# Patient Record
Sex: Male | Born: 1990 | Race: White | Hispanic: No | Marital: Married | Smoking: Never smoker
Health system: Southern US, Community
[De-identification: ages and names within clinical notes are randomized; demographics above are authoritative.]

## PROBLEM LIST (undated history)

## (undated) DIAGNOSIS — M545 Low back pain, unspecified: Secondary | ICD-10-CM

## (undated) DIAGNOSIS — F909 Attention-deficit hyperactivity disorder, unspecified type: Secondary | ICD-10-CM

## (undated) HISTORY — PX: LARYNGOSCOPY: SUR817

---

## 2009-04-02 ENCOUNTER — Ambulatory Visit: Payer: Self-pay | Admitting: Diagnostic Radiology

## 2009-04-02 ENCOUNTER — Emergency Department (HOSPITAL_BASED_OUTPATIENT_CLINIC_OR_DEPARTMENT_OTHER): Admission: EM | Admit: 2009-04-02 | Discharge: 2009-04-02 | Payer: Self-pay | Admitting: Emergency Medicine

## 2010-03-19 ENCOUNTER — Emergency Department (HOSPITAL_BASED_OUTPATIENT_CLINIC_OR_DEPARTMENT_OTHER): Admission: EM | Admit: 2010-03-19 | Discharge: 2010-03-19 | Payer: Self-pay | Admitting: Emergency Medicine

## 2011-11-15 ENCOUNTER — Emergency Department (HOSPITAL_COMMUNITY)
Admission: EM | Admit: 2011-11-15 | Discharge: 2011-11-15 | Disposition: A | Discharge status: Home / Self Care | Payer: No Typology Code available for payment source | Attending: Emergency Medicine | Admitting: Emergency Medicine

## 2011-11-15 ENCOUNTER — Emergency Department (HOSPITAL_COMMUNITY): Payer: No Typology Code available for payment source

## 2011-11-15 ENCOUNTER — Encounter (HOSPITAL_COMMUNITY): Payer: Self-pay | Admitting: Emergency Medicine

## 2011-11-15 DIAGNOSIS — S39012A Strain of muscle, fascia and tendon of lower back, initial encounter: Secondary | ICD-10-CM

## 2011-11-15 DIAGNOSIS — M25569 Pain in unspecified knee: Secondary | ICD-10-CM | POA: Insufficient documentation

## 2011-11-15 DIAGNOSIS — S060XAA Concussion with loss of consciousness status unknown, initial encounter: Secondary | ICD-10-CM | POA: Insufficient documentation

## 2011-11-15 DIAGNOSIS — S060X9A Concussion with loss of consciousness of unspecified duration, initial encounter: Secondary | ICD-10-CM | POA: Insufficient documentation

## 2011-11-15 DIAGNOSIS — Y92410 Unspecified street and highway as the place of occurrence of the external cause: Secondary | ICD-10-CM | POA: Insufficient documentation

## 2011-11-15 DIAGNOSIS — R404 Transient alteration of awareness: Secondary | ICD-10-CM | POA: Insufficient documentation

## 2011-11-15 DIAGNOSIS — S335XXA Sprain of ligaments of lumbar spine, initial encounter: Secondary | ICD-10-CM | POA: Insufficient documentation

## 2011-11-15 DIAGNOSIS — M5090 Cervical disc disorder, unspecified, unspecified cervical region: Secondary | ICD-10-CM | POA: Insufficient documentation

## 2011-11-15 DIAGNOSIS — M5380 Other specified dorsopathies, site unspecified: Secondary | ICD-10-CM | POA: Insufficient documentation

## 2011-11-15 DIAGNOSIS — T1490XA Injury, unspecified, initial encounter: Secondary | ICD-10-CM | POA: Insufficient documentation

## 2011-11-15 HISTORY — DX: Attention-deficit hyperactivity disorder, unspecified type: F90.9

## 2011-11-15 HISTORY — DX: Low back pain, unspecified: M54.50

## 2011-11-15 HISTORY — DX: Low back pain: M54.5

## 2011-11-15 MED ORDER — ACETAMINOPHEN 325 MG PO TABS
650.0000 mg | ORAL_TABLET | Freq: Once | ORAL | Status: AC
  Administered 2011-11-15: 650 mg via ORAL
  Filled 2011-11-15: qty 2
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
## 2011-11-15 NOTE — Discharge Instructions (Signed)
Back Pain, Adult Low back pain is very common. About 1 in 5 people have back pain.The cause of low back pain is rarely dangerous. The pain often gets better over time.About half of people with a sudden onset of back pain feel better in just 2 weeks. About 8 in 10 people feel better by 6 weeks.  CAUSES Some common causes of back pain include:  Strain of the muscles or ligaments supporting the spine.   Wear and tear (degeneration) of the spinal discs.   Arthritis.   Direct injury to the back.  DIAGNOSIS Most of the time, the direct cause of low back pain is not known.However, back pain can be treated effectively even when the exact cause of the pain is unknown.Answering your caregiver's questions about your overall health and symptoms is one of the most accurate ways to make sure the cause of your pain is not dangerous. If your caregiver needs more information, he or she may order lab work or imaging tests (X-rays or MRIs).However, even if imaging tests show changes in your back, this usually does not require surgery. HOME CARE INSTRUCTIONS For many people, back pain returns.Since low back pain is rarely dangerous, it is often a condition that people can learn to manageon their own.   Remain active. It is stressful on the back to sit or stand in one place. Do not sit, drive, or stand in one place for more than 30 minutes at a time. Take short walks on level surfaces as soon as pain allows.Try to increase the length of time you walk each day.   Do not stay in bed.Resting more than 1 or 2 days can delay your recovery.   Do not avoid exercise or work.Your body is made to move.It is not dangerous to be active, even though your back may hurt.Your back will likely heal faster if you return to being active before your pain is gone.   Pay attention to your body when you bend and lift. Many people have less discomfortwhen lifting if they bend their knees, keep the load close to their  bodies,and avoid twisting. Often, the most comfortable positions are those that put less stress on your recovering back.   Find a comfortable position to sleep. Use a firm mattress and lie on your side with your knees slightly bent. If you lie on your back, put a pillow under your knees.   Only take over-the-counter or prescription medicines as directed by your caregiver. Over-the-counter medicines to reduce pain and inflammation are often the most helpful.Your caregiver may prescribe muscle relaxant drugs.These medicines help dull your pain so you can more quickly return to your normal activities and healthy exercise.   Put ice on the injured area.   Put ice in a plastic bag.   Place a towel between your skin and the bag.   Leave the ice on for 15 to 20 minutes, 3 to 4 times a day for the first 2 to 3 days. After that, ice and heat may be alternated to reduce pain and spasms.   Ask your caregiver about trying back exercises and gentle massage. This may be of some benefit.   Avoid feeling anxious or stressed.Stress increases muscle tension and can worsen back pain.It is important to recognize when you are anxious or stressed and learn ways to manage it.Exercise is a great option.  SEEK MEDICAL CARE IF:  You have pain that is not relieved with rest or medicine.   You have   pain that does not improve in 1 week.   You have new symptoms.   You are generally not feeling well.  SEEK IMMEDIATE MEDICAL CARE IF:   You have pain that radiates from your back into your legs.   You develop new bowel or bladder control problems.   You have unusual weakness or numbness in your arms or legs.   You develop nausea or vomiting.   You develop abdominal pain.   You feel faint.  Document Released: 09/17/2005 Document Revised: 05/30/2011 Document Reviewed: 02/05/2011 [facility] Patient Information 2012 [place], [place].Back Pain, Adult Low back pain is very common. About 1 in 5 people have back  pain.The cause of low back pain is rarely dangerous. The pain often gets better over time.About half of people with a sudden onset of back pain feel better in just 2 weeks. About 8 in 10 people feel better by 6 weeks.  CAUSES Some common causes of back pain include:  Strain of the muscles or ligaments supporting the spine.   Wear and tear (degeneration) of the spinal discs.   Arthritis.   Direct injury to the back.  DIAGNOSIS Most of the time, the direct cause of low back pain is not known.However, back pain can be treated effectively even when the exact cause of the pain is unknown.Answering your caregiver's questions about your overall health and symptoms is one of the most accurate ways to make sure the cause of your pain is not dangerous. If your caregiver needs more information, he or she may order lab work or imaging tests (X-rays or MRIs).However, even if imaging tests show changes in your back, this usually does not require surgery. HOME CARE INSTRUCTIONS For many people, back pain returns.Since low back pain is rarely dangerous, it is often a condition that people can learn to [facility] their own.   Remain active. It is stressful on the back to sit or stand in one place. Do not sit, drive, or stand in one place for more than 30 minutes at a time. Take short walks on level surfaces as soon as pain allows.Try to increase the length of time you walk each day.   Do not stay in bed.Resting more than 1 or 2 days can delay your recovery.   Do not avoid exercise or work.Your body is made to move.It is not dangerous to be active, even though your back may hurt.Your back will likely heal faster if you return to being active before your pain is gone.   Pay attention to your body when you bend and lift. Many people have less discomfortwhen lifting if they bend their knees, keep the load close to their bodies,and avoid twisting. Often, the most comfortable positions are those that put  less stress on your recovering back.   Find a comfortable position to sleep. Use a firm mattress and lie on your side with your knees slightly bent. If you lie on your back, put a pillow under your knees.   Only take over-the-counter or prescription medicines as directed by your caregiver. Over-the-counter medicines to reduce pain and inflammation are often the most helpful.Your caregiver may prescribe muscle relaxant drugs.These medicines help dull your pain so you can more quickly return to your normal activities and healthy exercise.   Put ice on the injured area.   Put ice in a plastic bag.   Place a towel between your skin and the bag.   Leave the ice on for 15 to 20 minutes, 3 to  4 times a day for the first 2 to 3 days. After that, ice and heat may be alternated to reduce pain and spasms.   Ask your caregiver about trying back exercises and gentle massage. This may be of some benefit.   Avoid feeling anxious or stressed.Stress increases muscle tension and can worsen back pain.It is important to recognize when you are anxious or stressed and learn ways to manage it.Exercise is a great option.  SEEK MEDICAL CARE IF:  You have pain that is not relieved with rest or medicine.   You have pain that does not improve in 1 week.   You have new symptoms.   You are generally not feeling well.  SEEK IMMEDIATE MEDICAL CARE IF:   You have pain that radiates from your back into your legs.   You develop new bowel or bladder control problems.   You have unusual weakness or numbness in your arms or legs.   You develop nausea or vomiting.   You develop abdominal pain.   You feel faint.  Document Released: 09/17/2005 Document Revised: 05/30/2011 Document Reviewed: 02/05/2011 [facility] Patient Information 2012 [place], [place].
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
## 2018-12-15 ENCOUNTER — Ambulatory Visit: Payer: Self-pay | Admitting: Psychiatry

## 2018-12-16 ENCOUNTER — Ambulatory Visit: Payer: Self-pay | Admitting: Psychiatry

## 2018-12-17 ENCOUNTER — Other Ambulatory Visit: Payer: Self-pay

## 2018-12-17 ENCOUNTER — Encounter: Payer: Self-pay | Admitting: Psychiatry

## 2018-12-17 ENCOUNTER — Ambulatory Visit (INDEPENDENT_AMBULATORY_CARE_PROVIDER_SITE_OTHER): Payer: Self-pay | Admitting: Psychiatry

## 2018-12-17 VITALS — BP 143/76 | HR 72 | Ht 72.0 in | Wt 185.0 lb

## 2018-12-17 DIAGNOSIS — F319 Bipolar disorder, unspecified: Secondary | ICD-10-CM

## 2018-12-17 DIAGNOSIS — F411 Generalized anxiety disorder: Secondary | ICD-10-CM

## 2018-12-17 DIAGNOSIS — F4310 Post-traumatic stress disorder, unspecified: Secondary | ICD-10-CM

## 2018-12-17 MED ORDER — FLUOXETINE HCL 20 MG PO CAPS: ORAL_CAPSULE | ORAL | 0 refills | Status: DC

## 2018-12-17 MED ORDER — BUSPIRONE HCL 15 MG PO TABS: ORAL_TABLET | ORAL | 1 refills | Status: DC

## 2018-12-17 MED ORDER — DIVALPROEX SODIUM 500 MG PO DR TAB: DELAYED_RELEASE_TABLET | ORAL | 1 refills | Status: DC

## 2018-12-18 ENCOUNTER — Encounter: Payer: Self-pay | Admitting: Psychiatry

## 2018-12-18 ENCOUNTER — Telehealth: Payer: Self-pay | Admitting: Psychiatry

## 2018-12-18 NOTE — Progress Notes (Signed)
[facility] MD/PA/NP Initial Note  12/18/2018 9:59 AM Jorge Tate  MRN:  [id number]  Chief Complaint:  Chief Complaint    Anxiety; Depression; Manic Behavior      HPI: Patient is a 28 year old male who presents for initial evaluation for treatment of anxiety, depression, and manic signs and symptoms.  He reports that he was encouraged to seek treatment by his wife and his therapist and that his therapist, Tina Griffiths, discussed that patient has exhibited signs and symptoms suggestive of bipolar disorder.  He reports that his wife is concerned about his stress, anxiety with doing new things, and low sex drive.  He reports "I"ve always been really up and down." Recalls depressive episodes as early as 6th grade.  He reports mood s/s have gradually worsened and have been significantly worse over the last 2 years. Patient reports that he was taking Vyvanse and Prozac last year and PCP thought he needed to stop them and "it was great for about 3 months and then everything came crashing down." Has had 5 jobs since then and has been "stressed out."  He reports that his mood has significantly worsened in recent years following the death of several family members, to include a traumatic event.  Patient reports mood lability.  He reports that his mood may be "really up for 3 days then miserable for 5." Reports that mood shifts can occur as quickly as every few hours. Reports that he also has periods where mood is depressed and irritable. Reports poor sleep. Reports that he may sleep 9 hours one night and 4 hours the next night. Sleeps more when mood is depressed and less when mood is elevated. Reports periods of elevated energy alternating with low energy when depressed. Appetite also fluctuates- "I'm either really hungry or not at all." Weight has been stable. Denies impaired concentration. Reports during depressed periods his energy and motivation are low and he experiences anhedonia. Denies SI.   Reports  that he has periods of elevated mood for typically about 3 days with longest episode being about a week in duration. He reports that his energy is elevated and he is doing more of the activities that he enjoys. Reports that irritability is higher at times during these periods. Occasional racing thoughts. Periods of increased talkativeness. Reports some periods where self-confidence was slightly elevated.  Reports periods of slightly increased goal-directed activity. Denies risky or impulsive behaviors. Denies increased interest in sex.   Reports long-standing worry and anxiety since childhood. He reports that he will "dread" events several days in a row. Reports some rumination and catastrophic thoughts. Will occasionally experience muscle tension and increased heart rate with increased heart rate. Anxiety is worse  Reports intrusive memories and re-experiencing. Reports some nightmares about traumatic events. Reports nightmares have gradually decreased in frequency- "but when they happen they are pretty bad." Reports hyper-vigilance and occasional exaggerated startle responses.   Denies social anxiety, obsessions, or compulsions.  He reports anxiety. Has had severe panic attacks and had to leave due to panic attacks at 2 jobs and then was released. He reports that he is currently having panic attacks a couple times a week. Reports that panic attacks recently started and that they will occur out of the blue without apparent trigger or pattern.   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 He reports he has severe irritability and will lose his temper quickly over things like someone insulting his favorite sports teams. He reports that he avoids having conversations with certain people since he knows it may trigger his anger. He denies physical agitation.  Denies irritability or anger causing problems at work.   Was dx'd with ADHD in first grade and started treatment then. Denies hyperactivity. Denies significant difficulty with concentration currently.   Reports decreased libido.   Reports that he has had some hypnopompic AH and thinking he hears someone coming in the house. Otherwise denies AH, VH, or paranoia.  Born and raised in [place]. Has 2 brothers and 3 sisters. He is the second to the youngest. Reports that some of his siblings are half-siblings. He grew up with brother and 2 sisters. He was raised by both parents and reports things were "good." Father died unexpectedly in 01/31/16 from pulmonary embolism. He was present for his father's death and had to perform CPR. His wife was also there. He went into a "drinking binge." Reports drinking 6-12 beers for 5 months. Reports he stopped drinking and now drinks very rarely. Grandmother died the following year.  Graduated high school. Has been working in [employer]. Married x 2 years. He reports that their relationship "seems to be aright" and wife is concerned about his mental health. Pt and his wife had a still born in January. They do not have any children. Wife, brother, and mother are main psychosocial supports. Umpires baseball. Enjoys motor sports and watching football.     Visit Diagnosis:    ICD-10-CM   1. Bipolar affective disorder, remission status unspecified (HCC) F31.9 divalproex (DEPAKOTE) 500 MG DR tablet  2. Generalized anxiety disorder F41.1 busPIRone (BUSPAR) 15 MG tablet  3. PTSD (post-traumatic stress disorder) F43.10 busPIRone (BUSPAR) 15 MG tablet    Past Psychiatric History:   Past Medical History:  Past Medical History:  Diagnosis Date  . ADHD (attention deficit hyperactivity disorder)   . Low back pain     Past Surgical History:  Procedure Laterality Date  . LARYNGOSCOPY      Family History:  Family History  Problem Relation Age of Onset  . Anxiety  disorder Father   . Pulmonary embolism Father     Social History:  Social History   Socioeconomic History  . Marital status: Married    Spouse name: Not on file  . Number of children: Not on file  . Years of education: Not on file  . Highest education level: Not on file  Occupational History  . Not on file  Social Needs  . Financial resource strain: Not on file  . Food insecurity:    Worry: Not on file    Inability: Not on file  . Transportation needs:    Medical: Not on file    Non-medical: Not on file  Tobacco Use  . Smoking status: Never Smoker  . Smokeless tobacco: Never Used  Substance and Sexual Activity  . Alcohol use: Yes    Comment: rare  . Drug use: Not Currently  . Sexual activity: Not on file  Lifestyle  . Physical activity:    Days per week: Not on file    Minutes per session: Not on file  . Stress: Not on file  Relationships  . Social connections:    Talks on phone: Not on file    Gets together: Not on file    Attends religious service: Not on file  Active member of club or organization: Not on file    Attends meetings of clubs or organizations: Not on file    Relationship status: Not on file  Other Topics Concern  . Not on file  Social History Narrative  . Not on file    Allergies: No Known Allergies  Metabolic Disorder Labs: No results found for: HGBA1C, MPG No results found for: PROLACTIN No results found for: CHOL, TRIG, HDL, CHOLHDL, VLDL, LDLCALC No results found for: TSH  Therapeutic Level Labs: No results found for: LITHIUM No results found for: VALPROATE No components found for:  CBMZ  Current Medications: Current Outpatient Medications  Medication Sig Dispense Refill  . FLUoxetine (PROZAC) 20 MG capsule Take Prozac every other day 30 capsule 0  . Melatonin 10 MG TABS Take 20 mg by mouth every other day.    . busPIRone (BUSPAR) 15 MG tablet Take 1/3 tablet p.o. twice daily for 1 week, then take 2/3 tablet p.o. twice daily  for 1 week, then take 1 tablet p.o. twice daily 60 tablet 1  . divalproex (DEPAKOTE) 500 MG DR tablet Take 1 tablet at bedtime for 5-7 days, then increase to 1 tablet twice daily 60 tablet 1   No current facility-administered medications for this visit.     Medication Side Effects: N/A  Orders placed this visit:  No orders of the defined types were placed in this encounter.   Psychiatric Specialty Exam:  ROS  Blood pressure (!) 143/76, pulse 72, height 6' (1.829 m), weight 185 lb (83.9 kg).Body mass index is 25.09 kg/m.  General Appearance: Casual and Neat  Eye Contact:  Good  Speech:  Clear and Coherent and Normal Rate  Volume:  Normal  Mood:  Anxious and Depressed  Affect:  Blunt  Thought Process:  Coherent  Orientation:  Full (Time, Place, and Person)  Thought Content: Logical   Suicidal Thoughts:  No  Homicidal Thoughts:  No  Memory:  WNL  Judgement:  Good  Insight:  Good  Psychomotor Activity:  Normal  Concentration:  Concentration: Fair  Recall:  Good  Fund of Knowledge: Good  Language: Good  Assets:  Communication Skills Desire for Improvement Resilience Social Support  ADL's:  Intact  Cognition: WNL  Prognosis:  Good   Receiving Psychotherapy: Yes   Treatment Plan/Recommendations: Patient seen for 60 minutes and greater than 50% of visit spent counseling patient regarding signs and symptoms of bipolar disorder, to include describing different mood states such as mania, hypomania, and depression.  Also counseled patient regarding anxiety signs and symptoms.  Discussed types of medications used to treat bipolar disorder and anxiety.  Case staffed with Dr. Jennelle Human.  Will start divalproex 500 mg p.o. nightly for 5 to 7 days, then increase to 1 tablet twice daily for mood stabilization.  Discussed potential benefits, risk, and side effects of Depakote and BuSpar.  Discussed that patient may require occasional lab monitoring with Depakote to assess for changes in liver  enzymes, CBC, or to obtain therapeutic drug level.  Start BuSpar 15 mg 1/3 tablet twice daily for 1 week, then increase to 2/3 tablet twice daily for 1 week, then increase to 1 tablet twice daily for anxiety.  Recommend continuing psychotherapy with [facility].  Patient to follow-up with this provider in 3 to 4 weeks or sooner if clinically indicated. Patient advised to contact office with any questions, adverse effects, or acute worsening in signs and symptoms.     Corie Chiquito, PMHNP

## 2018-12-18 NOTE — Telephone Encounter (Signed)
Pt started on new med and its making him fuzzy headed. His employer sent him home for today and tomorrow. He would like a note to give to his job. He will be in [place] tomorrow around 9am to pick it up.

## 2019-01-28 ENCOUNTER — Encounter: Payer: Self-pay | Admitting: Psychiatry

## 2019-01-28 ENCOUNTER — Ambulatory Visit (INDEPENDENT_AMBULATORY_CARE_PROVIDER_SITE_OTHER): Payer: Self-pay | Admitting: Psychiatry

## 2019-01-28 ENCOUNTER — Other Ambulatory Visit: Payer: Self-pay

## 2019-01-28 DIAGNOSIS — F411 Generalized anxiety disorder: Secondary | ICD-10-CM

## 2019-01-28 DIAGNOSIS — F4310 Post-traumatic stress disorder, unspecified: Secondary | ICD-10-CM

## 2019-01-28 DIAGNOSIS — F319 Bipolar disorder, unspecified: Secondary | ICD-10-CM

## 2019-01-28 MED ORDER — BUSPIRONE HCL 15 MG PO TABS: 15.0000 mg | ORAL_TABLET | Freq: Two times a day (BID) | ORAL | 1 refills | Status: DC

## 2019-01-28 MED ORDER — DIVALPROEX SODIUM 500 MG PO DR TAB: DELAYED_RELEASE_TABLET | ORAL | 1 refills | Status: DC

## 2019-01-28 NOTE — Progress Notes (Signed)
Jorge Tate [id number] 1991/08/21 28 y.o.  Virtual Visit via Telephone Note  I connected with@ on 01/28/19 at  9:00 AM EDT by telephone and verified that I am speaking with the correct person using two identifiers.   I discussed the limitations, risks, security and privacy concerns of performing an evaluation and management service by telephone and the availability of in person appointments. I also discussed with the patient that there may be a patient responsible charge related to this service. The patient expressed understanding and agreed to proceed.   I discussed the assessment and treatment plan with the patient. The patient was provided an opportunity to ask questions and all were answered. The patient agreed with the plan and demonstrated an understanding of the instructions.   The patient was advised to call back or seek an in-person evaluation if the symptoms worsen or if the condition fails to improve as anticipated.  I provided 30 minutes of non-face-to-face time during this encounter.  The patient was located at home.  The provider was located at home.   Corie Chiquito, PMHNP   Subjective:   Patient ID:  Jorge Tate is a 28 y.o. (DOB 02-15-1991) male.  Chief Complaint:  Chief Complaint  Patient presents with  . Other    Mood lability, irritability  . Insomnia  . Anxiety    HPI Jorge Tate presents for follow-up of mood instability. "The medicine seems to be working." He reports that he has been calmer and no longer feeling as stressed. He reports that his mood has improved. He reports that he has only experienced irritability about 3-4 days since last visit. He reports that he has had one episode where he lost his temper about a week ago. He reports some slight mood lability and that overall mood lability has improved. Denies recent depressed mood. Reports that he continues to have some irritability. Denies any recent manic s/s. Denies any recent panic  attacks.  He reports that he has trouble getting to sleep a couple of times a week. He reports that sometimes after he takes Buspar he feels more awake. He reports that he usually takes Buspar at 9:30 pm. He reports that is sleeping about 7.5 hours a night and was previously sleeping 5-6 hours a night. He reports that he has been having nightmares and that it may be occuring more frequently. He reports that his appetite has been good. Reports energy and motivation have been good. Denies concentration difficulties. Denies anhedonia. Denies SI.   Has not been able to see therapist in the last few weeks due to insurance non-coverage.   Review of Systems:  Review of Systems  Musculoskeletal: Negative for gait problem.  Neurological: Negative for tremors.  Psychiatric/Behavioral:       Please refer to HPI    Medications: I have reviewed the patient's current medications.  Current Outpatient Medications  Medication Sig Dispense Refill  . busPIRone (BUSPAR) 15 MG tablet Take 1 tablet (15 mg total) by mouth 2 (two) times daily for 30 days. 60 tablet 1  . divalproex (DEPAKOTE) 500 MG DR tablet Take 1 tablet (500 mg total) by mouth every morning AND 2 tablets (1,000 mg total) at bedtime. Do all this for 30 days. Take 1 tablet at bedtime for 5-7 days, then increase to 1 tablet twice daily. 90 tablet 1   No current facility-administered medications for this visit.     Medication Side Effects: Other: Occ diff with sleep initiation  Allergies: No Known Allergies  Past Medical History:  Diagnosis Date  . ADHD (attention deficit hyperactivity disorder)   . Low back pain     Family History  Problem Relation Age of Onset  . Anxiety disorder Father   . Pulmonary embolism Father     Social History   Socioeconomic History  . Marital status: Married    Spouse name: Not on file  . Number of children: Not on file  . Years of education: Not on file  . Highest education level: Not on file   Occupational History  . Not on file  Social Needs  . Financial resource strain: Not on file  . Food insecurity:    Worry: Not on file    Inability: Not on file  . Transportation needs:    Medical: Not on file    Non-medical: Not on file  Tobacco Use  . Smoking status: Never Smoker  . Smokeless tobacco: Never Used  Substance and Sexual Activity  . Alcohol use: Yes    Comment: rare  . Drug use: Not Currently  . Sexual activity: Not on file  Lifestyle  . Physical activity:    Days per week: Not on file    Minutes per session: Not on file  . Stress: Not on file  Relationships  . Social connections:    Talks on phone: Not on file    Gets together: Not on file    Attends religious service: Not on file    Active member of club or organization: Not on file    Attends meetings of clubs or organizations: Not on file    Relationship status: Not on file  . Intimate partner violence:    Fear of current or ex partner: Not on file    Emotionally abused: Not on file    Physically abused: Not on file    Forced sexual activity: Not on file  Other Topics Concern  . Not on file  Social History Narrative  . Not on file    Past Medical History, Surgical history, Social history, and Family history were reviewed and updated as appropriate.   Please see review of systems for further details on the patient's review from today.   Objective:   Physical Exam:  There were no vitals taken for this visit.  Physical Exam Neurological:     Mental Status: He is alert and oriented to person, place, and time.     Cranial Nerves: No dysarthria.  Psychiatric:        Attention and Perception: Attention normal.        Speech: Speech normal.        Behavior: Behavior is cooperative.        Thought Content: Thought content normal. Thought content is not paranoid or delusional. Thought content does not include homicidal or suicidal ideation. Thought content does not include homicidal or suicidal  plan.        Cognition and Memory: Cognition and memory normal.        Judgment: Judgment normal.     Comments: Mildly irritable mood     Lab Review:  No results found for: NA, K, CL, CO2, GLUCOSE, BUN, CREATININE, CALCIUM, PROT, ALBUMIN, AST, ALT, ALKPHOS, BILITOT, GFRNONAA, GFRAA  No results found for: WBC, RBC, HGB, HCT, PLT, MCV, MCH, MCHC, RDW, LYMPHSABS, MONOABS, EOSABS, BASOSABS  No results found for: POCLITH, LITHIUM   No results found for: PHENYTOIN, PHENOBARB, VALPROATE, CBMZ   .res Assessment: Plan:    Discussed taking the second  dose of Buspar earlier in the evening, such as at 6 pm instead of 9:30 pm, to minimize risk of sleep disturbance.  Discussed that he could take BuSpar even earlier if needed.  Will continue BuSpar 15 mg twice daily for anxiety since this has been helpful overall for patient's anxiety.  Will increase Depakote to 500 mg p.o. every morning and 1000 mg p.o. nightly to further improve mood lability and irritability.  Recommend follow-up in 4 weeks when patient reports that his insurance will become effective, and will then decide if further titration of Depakote is indicated or if he should continue current dose.  Will consider obtaining labs at that time to monitor for any potential adverse effects with Depakote and to obtain drug level.  Patient to follow-up in 4 weeks or sooner if clinically indicated. Patient advised to contact office with any questions, adverse effects, or acute worsening in signs and symptoms.  Bipolar affective disorder, remission status unspecified (HCC) - Plan: divalproex (DEPAKOTE) 500 MG DR tablet  Generalized anxiety disorder - Plan: busPIRone (BUSPAR) 15 MG tablet  PTSD (post-traumatic stress disorder) - Plan: busPIRone (BUSPAR) 15 MG tablet  Please see After Visit Summary for patient specific instructions.  No future appointments.  No orders of the defined types were placed in this encounter.      -------------------------------

## 2019-02-16 ENCOUNTER — Other Ambulatory Visit: Payer: Self-pay | Admitting: Psychiatry

## 2019-02-16 DIAGNOSIS — F319 Bipolar disorder, unspecified: Secondary | ICD-10-CM

## 2019-02-18 ENCOUNTER — Other Ambulatory Visit: Payer: Self-pay

## 2019-02-18 ENCOUNTER — Telehealth: Payer: Self-pay | Admitting: Psychiatry

## 2019-02-18 DIAGNOSIS — F319 Bipolar disorder, unspecified: Secondary | ICD-10-CM

## 2019-02-18 MED ORDER — DIVALPROEX SODIUM 500 MG PO DR TAB: DELAYED_RELEASE_TABLET | ORAL | 0 refills | Status: DC

## 2019-02-18 MED ORDER — DIVALPROEX SODIUM 500 MG PO DR TAB
DELAYED_RELEASE_TABLET | ORAL | 0 refills | Status: DC
Start: 2019-02-18 — End: 2019-02-18

## 2019-02-18 MED ORDER — DIVALPROEX SODIUM 500 MG PO DR TAB: DELAYED_RELEASE_TABLET | ORAL | 1 refills | Status: DC

## 2019-02-18 NOTE — Telephone Encounter (Signed)
Pt request refill on Depakote @ [facility] in [facility]

## 2019-02-18 NOTE — Telephone Encounter (Signed)
Finally got his rx from 04/29 resubmitted to [facility]. Looks like it printed instead of escribing previously

## 2019-02-18 NOTE — Telephone Encounter (Signed)
Resubmitting rx for depakote to [facility], said they didn't receive.

## 2019-02-26 ENCOUNTER — Encounter: Payer: Self-pay | Admitting: Psychiatry

## 2019-02-26 ENCOUNTER — Ambulatory Visit (INDEPENDENT_AMBULATORY_CARE_PROVIDER_SITE_OTHER): Payer: Self-pay | Admitting: Psychiatry

## 2019-02-26 ENCOUNTER — Other Ambulatory Visit: Payer: Self-pay

## 2019-02-26 DIAGNOSIS — F319 Bipolar disorder, unspecified: Secondary | ICD-10-CM

## 2019-02-26 DIAGNOSIS — F411 Generalized anxiety disorder: Secondary | ICD-10-CM

## 2019-02-26 DIAGNOSIS — F4310 Post-traumatic stress disorder, unspecified: Secondary | ICD-10-CM

## 2019-02-26 NOTE — Progress Notes (Signed)
Jorge Tate [id number] 11/30/1990 28 y.o.  Virtual Visit via Telephone Note  I connected with@ on 02/27/19 at  3:30 PM EDT by telephone and verified that I am speaking with the correct person using two identifiers.   I discussed the limitations, risks, security and privacy concerns of performing an evaluation and management service by telephone and the availability of in person appointments. I also discussed with the patient that there may be a patient responsible charge related to this service. The patient expressed understanding and agreed to proceed.   I discussed the assessment and treatment plan with the patient. The patient was provided an opportunity to ask questions and all were answered. The patient agreed with the plan and demonstrated an understanding of the instructions.   The patient was advised to call back or seek an in-person evaluation if the symptoms worsen or if the condition fails to improve as anticipated.  I provided 30 minutes of non-face-to-face time during this encounter.  The patient was located at home.  The provider was located at [facility].   Corie ChiquitoJessica Mahonri Seiden, PMHNP   Subjective:   Patient ID:  Jorge Tate is a 28 y.o. (DOB 11/21/1990) male.  Chief Complaint:  Chief Complaint  Patient presents with  . Follow-up    h/o Depression and Anxiety    HPI Jorge FamJustin Underhill presents for follow-up of mood and anxiety. Pt reports that his anxiety had been better than it has been. He reports that he continues to experience some anxiety and down mood in the morning and then it improves by mid-day. He reports mood has been "better" and reports improved mood with increase in Depakote. He reports that irritability has decreased. He reports that he has had one episode of losing his temper and that this has improved. Some sadness in the morning has been consistent with the past. Reports that he is sleeping better with change in admin time of Buspar. Sleeping 7  hours every night. He reports that his appetite has been the same. Energy and motivation have been ok. Concentration has been good. Denies SI.   Denies risky or impulsive behavior.   He reports that he is having multiple episodes of diarrhea throughout the day and that diarrhea will occur for multiple consecutive days and then resolve for a few days and then return.   Review of Systems:  Review of Systems  Gastrointestinal: Positive for diarrhea. Negative for nausea and vomiting.  Musculoskeletal: Negative for gait problem.  Neurological: Negative for tremors.  Psychiatric/Behavioral:       Please refer to HPI    Medications: I have reviewed the patient's current medications.  Current Outpatient Medications  Medication Sig Dispense Refill  . busPIRone (BUSPAR) 15 MG tablet Take 1 tablet (15 mg total) by mouth 2 (two) times daily for 30 days. 60 tablet 1  . divalproex (DEPAKOTE ER) 500 MG 24 hr tablet Take 3 tablets (1,500 mg total) by mouth at bedtime for 30 days. 90 tablet 1   No current facility-administered medications for this visit.     Medication Side Effects: Other: possible diarrhea  Allergies: No Known Allergies  Past Medical History:  Diagnosis Date  . ADHD (attention deficit hyperactivity disorder)   . Low back pain     Family History  Problem Relation Age of Onset  . Anxiety disorder Father   . Pulmonary embolism Father     Social History   Socioeconomic History  . Marital status: Married    Spouse name: Not  on file  . Number of children: Not on file  . Years of education: Not on file  . Highest education level: Not on file  Occupational History  . Not on file  Social Needs  . Financial resource strain: Not on file  . Food insecurity:    Worry: Not on file    Inability: Not on file  . Transportation needs:    Medical: Not on file    Non-medical: Not on file  Tobacco Use  . Smoking status: Never Smoker  . Smokeless tobacco: Never Used   Substance and Sexual Activity  . Alcohol use: Yes    Comment: rare  . Drug use: Not Currently  . Sexual activity: Not on file  Lifestyle  . Physical activity:    Days per week: Not on file    Minutes per session: Not on file  . Stress: Not on file  Relationships  . Social connections:    Talks on phone: Not on file    Gets together: Not on file    Attends religious service: Not on file    Active member of club or organization: Not on file    Attends meetings of clubs or organizations: Not on file    Relationship status: Not on file  . Intimate partner violence:    Fear of current or ex partner: Not on file    Emotionally abused: Not on file    Physically abused: Not on file    Forced sexual activity: Not on file  Other Topics Concern  . Not on file  Social History Narrative  . Not on file    Past Medical History, Surgical history, Social history, and Family history were reviewed and updated as appropriate.   Please see review of systems for further details on the patient's review from today.   Objective:   Physical Exam:  There were no vitals taken for this visit.  Physical Exam Neurological:     Mental Status: He is alert and oriented to person, place, and time.     Cranial Nerves: No dysarthria.  Psychiatric:        Attention and Perception: Attention normal.        Speech: Speech normal.        Behavior: Behavior is cooperative.        Thought Content: Thought content normal. Thought content is not paranoid or delusional. Thought content does not include homicidal or suicidal ideation. Thought content does not include homicidal or suicidal plan.        Cognition and Memory: Cognition and memory normal.        Judgment: Judgment normal.     Comments: Mood presents as mildly irritable.      Lab Review:  No results found for: NA, K, CL, CO2, GLUCOSE, BUN, CREATININE, CALCIUM, PROT, ALBUMIN, AST, ALT, ALKPHOS, BILITOT, GFRNONAA, GFRAA  No results found for:  WBC, RBC, HGB, HCT, PLT, MCV, MCH, MCHC, RDW, LYMPHSABS, MONOABS, EOSABS, BASOSABS  No results found for: POCLITH, LITHIUM   No results found for: PHENYTOIN, PHENOBARB, VALPROATE, CBMZ   .res Assessment: Plan:   Case staffed with Dr. Jennelle Human. Will change Divalproex to Depakote ER 1500 mg po QHS for mood stabilization. Will continue Buspar 15 mg po BID for anxiety.  Pt to f/u in 4 weeks. May extended f/u if needed until he has insurance coverage.  Bipolar affective disorder, remission status unspecified (HCC) - Plan: divalproex (DEPAKOTE ER) 500 MG 24 hr tablet  Generalized anxiety  disorder - Plan: busPIRone (BUSPAR) 15 MG tablet  PTSD (post-traumatic stress disorder) - Plan: busPIRone (BUSPAR) 15 MG tablet  Please see After Visit Summary for patient specific instructions.  Future Appointments  Date Time Provider Department Center  03/27/2019 10:00 AM Corie Chiquito, PMHNP [facility] None    No orders of the defined types were placed in this encounter.     -------------------------------

## 2019-02-27 MED ORDER — DIVALPROEX SODIUM ER 500 MG PO TB24: 1500.0000 mg | ORAL_TABLET | Freq: Every day | ORAL | 1 refills | Status: DC

## 2019-02-27 MED ORDER — BUSPIRONE HCL 15 MG PO TABS: 15.0000 mg | ORAL_TABLET | Freq: Two times a day (BID) | ORAL | 1 refills | Status: DC

## 2019-03-27 ENCOUNTER — Ambulatory Visit (INDEPENDENT_AMBULATORY_CARE_PROVIDER_SITE_OTHER): Payer: Self-pay | Admitting: Psychiatry

## 2019-03-27 ENCOUNTER — Other Ambulatory Visit: Payer: Self-pay

## 2019-03-27 ENCOUNTER — Encounter: Payer: Self-pay | Admitting: Psychiatry

## 2019-03-27 DIAGNOSIS — F4310 Post-traumatic stress disorder, unspecified: Secondary | ICD-10-CM

## 2019-03-27 DIAGNOSIS — F319 Bipolar disorder, unspecified: Secondary | ICD-10-CM

## 2019-03-27 DIAGNOSIS — F411 Generalized anxiety disorder: Secondary | ICD-10-CM

## 2019-03-27 MED ORDER — BUSPIRONE HCL 15 MG PO TABS: 15.0000 mg | ORAL_TABLET | Freq: Two times a day (BID) | ORAL | 1 refills | Status: DC

## 2019-03-27 MED ORDER — DIVALPROEX SODIUM ER 500 MG PO TB24: 1500.0000 mg | ORAL_TABLET | Freq: Every day | ORAL | 1 refills | Status: DC

## 2019-03-27 NOTE — Progress Notes (Signed)
Jorge Tate [id number] 1991-01-18 28 y.o.  Virtual Visit via Telephone Note  I connected with@ on 03/27/19 at 10:00 AM EDT by telephone and verified that I am speaking with the correct person using two identifiers.   I discussed the limitations, risks, security and privacy concerns of performing an evaluation and management service by telephone and the availability of in person appointments. I also discussed with the patient that there may be a patient responsible charge related to this service. The patient expressed understanding and agreed to proceed.   I discussed the assessment and treatment plan with the patient. The patient was provided an opportunity to ask questions and all were answered. The patient agreed with the plan and demonstrated an understanding of the instructions.   The patient was advised to call back or seek an in-person evaluation if the symptoms worsen or if the condition fails to improve as anticipated.  I provided 15 minutes of non-face-to-face time during this encounter.  The patient was located at home.  The provider was located at [place].   Thayer Headings, PMHNP   Subjective:   Patient ID:  Jorge Tate is a 28 y.o. (DOB 11-Apr-1991) male.  Chief Complaint:  Chief Complaint  Patient presents with  . Follow-up    h/o Irritability, Mood lability, and anxiety    HPI Jorge Tate presents for follow-up of mood and anxiety. He reports that diarrhea resolved with change in Depakote to ER. He reports that he had a "few rough days" of increased irritability throughout the month without apparent trigger. Periods of irritability can last hours to a couple of days. He reports that these episodes have not been occurring more frequently. He denies any recent depressed moods and has not experienced any sadness in the mornings recently. He reports that anxiety has been stable. He reports some increased anxiety when periods of irritability. Sleeping  better now. Appetite stable. Concentration has been adequate. Denies SI.   Denies impulsive or risky behavior. Denies sleeplessness.    Review of Systems:  Review of Systems  Gastrointestinal: Negative for diarrhea and nausea.  Musculoskeletal: Negative for gait problem.  Neurological: Negative for tremors.  Psychiatric/Behavioral:       Please refer to HPI    Medications: I have reviewed the patient's current medications.  Current Outpatient Medications  Medication Sig Dispense Refill  . busPIRone (BUSPAR) 15 MG tablet Take 1 tablet (15 mg total) by mouth 2 (two) times daily for 30 days. 60 tablet 1  . divalproex (DEPAKOTE ER) 500 MG 24 hr tablet Take 3 tablets (1,500 mg total) by mouth at bedtime for 30 days. 90 tablet 1   No current facility-administered medications for this visit.     Medication Side Effects: None  Allergies: No Known Allergies  Past Medical History:  Diagnosis Date  . ADHD (attention deficit hyperactivity disorder)   . Low back pain     Family History  Problem Relation Age of Onset  . Anxiety disorder Father   . Pulmonary embolism Father     Social History   Socioeconomic History  . Marital status: Married    Spouse name: Not on file  . Number of children: Not on file  . Years of education: Not on file  . Highest education level: Not on file  Occupational History  . Not on file  Social Needs  . Financial resource strain: Not on file  . Food insecurity    Worry: Not on file    Inability: Not  on file  . Transportation needs    Medical: Not on file    Non-medical: Not on file  Tobacco Use  . Smoking status: Never Smoker  . Smokeless tobacco: Never Used  Substance and Sexual Activity  . Alcohol use: Yes    Comment: rare  . Drug use: Not Currently  . Sexual activity: Not on file  Lifestyle  . Physical activity    Days per week: Not on file    Minutes per session: Not on file  . Stress: Not on file  Relationships  . Social  Musicianconnections    Talks on phone: Not on file    Gets together: Not on file    Attends religious service: Not on file    Active member of club or organization: Not on file    Attends meetings of clubs or organizations: Not on file    Relationship status: Not on file  . Intimate partner violence    Fear of current or ex partner: Not on file    Emotionally abused: Not on file    Physically abused: Not on file    Forced sexual activity: Not on file  Other Topics Concern  . Not on file  Social History Narrative  . Not on file    Past Medical History, Surgical history, Social history, and Family history were reviewed and updated as appropriate.   Please see review of systems for further details on the patient's review from today.   Objective:   Physical Exam:  There were no vitals taken for this visit.  Physical Exam Neurological:     Mental Status: He is alert and oriented to person, place, and time.     Cranial Nerves: No dysarthria.  Psychiatric:        Attention and Perception: Attention normal.        Mood and Affect: Mood normal.        Speech: Speech normal.        Behavior: Behavior is cooperative.        Thought Content: Thought content normal. Thought content is not paranoid or delusional. Thought content does not include homicidal or suicidal ideation. Thought content does not include homicidal or suicidal plan.        Cognition and Memory: Cognition and memory normal.        Judgment: Judgment normal.     Lab Review:  No results found for: NA, K, CL, CO2, GLUCOSE, BUN, CREATININE, CALCIUM, PROT, ALBUMIN, AST, ALT, ALKPHOS, BILITOT, GFRNONAA, GFRAA  No results found for: WBC, RBC, HGB, HCT, PLT, MCV, MCH, MCHC, RDW, LYMPHSABS, MONOABS, EOSABS, BASOSABS  No results found for: POCLITH, LITHIUM   No results found for: PHENYTOIN, PHENOBARB, VALPROATE, CBMZ   .res Assessment: Plan:   Pt reports that he would like to continue current medications for another month and  feels that overall medications have been beneficial.   Discussed that a VPA level may be helpful to determine if dose adjustment may be appropriate. Pt reports that he may be getting insurance in the near future and would prefer to wait until he has insurance. Discussed that pt may contact office to request lab order for a VPA level if he obtains insurance prior to next visit.  Continue Depakote ER 1500 mg po QHS for mood stabilization. Continue Buspar 15 mg po BID for anxiety.  Pt to f/u in 4-6 weeks or sooner if clinically indicated.  Patient advised to contact office with any questions, adverse effects, or  acute worsening in signs and symptoms.  Jorge Tate was seen today for follow-up.  Diagnoses and all orders for this visit:  Bipolar affective disorder, remission status unspecified (HCC) -     Discontinue: divalproex (DEPAKOTE ER) 500 MG 24 hr tablet; Take 3 tablets (1,500 mg total) by mouth at bedtime for 30 days. -     divalproex (DEPAKOTE ER) 500 MG 24 hr tablet; Take 3 tablets (1,500 mg total) by mouth at bedtime for 30 days.  Generalized anxiety disorder -     Discontinue: busPIRone (BUSPAR) 15 MG tablet; Take 1 tablet (15 mg total) by mouth 2 (two) times daily for 30 days. -     busPIRone (BUSPAR) 15 MG tablet; Take 1 tablet (15 mg total) by mouth 2 (two) times daily for 30 days.  PTSD (post-traumatic stress disorder) -     Discontinue: busPIRone (BUSPAR) 15 MG tablet; Take 1 tablet (15 mg total) by mouth 2 (two) times daily for 30 days. -     busPIRone (BUSPAR) 15 MG tablet; Take 1 tablet (15 mg total) by mouth 2 (two) times daily for 30 days.    Please see After Visit Summary for patient specific instructions.  No future appointments.  No orders of the defined types were placed in this encounter.     -------------------------------

## 2019-06-17 ENCOUNTER — Telehealth: Payer: Self-pay | Admitting: Psychiatry

## 2019-06-17 ENCOUNTER — Other Ambulatory Visit: Payer: Self-pay

## 2019-06-17 DIAGNOSIS — F4310 Post-traumatic stress disorder, unspecified: Secondary | ICD-10-CM

## 2019-06-17 DIAGNOSIS — F411 Generalized anxiety disorder: Secondary | ICD-10-CM

## 2019-06-17 MED ORDER — BUSPIRONE HCL 15 MG PO TABS: 15.0000 mg | ORAL_TABLET | Freq: Two times a day (BID) | ORAL | 1 refills | Status: DC

## 2019-06-17 NOTE — Telephone Encounter (Signed)
Patient scheduled appt for Friday 9/18 at 11 am

## 2019-06-17 NOTE — Telephone Encounter (Signed)
Refill submitted per request.

## 2019-06-17 NOTE — Telephone Encounter (Signed)
Patient called and said that he needs a refill on his buspirone 15 mg sent to the [facility] on [address] in [place] on [address].

## 2019-06-19 ENCOUNTER — Encounter: Payer: Self-pay | Admitting: Psychiatry

## 2019-06-19 ENCOUNTER — Other Ambulatory Visit: Payer: Self-pay

## 2019-06-19 ENCOUNTER — Ambulatory Visit (INDEPENDENT_AMBULATORY_CARE_PROVIDER_SITE_OTHER): Payer: Self-pay | Admitting: Psychiatry

## 2019-06-19 DIAGNOSIS — F4310 Post-traumatic stress disorder, unspecified: Secondary | ICD-10-CM

## 2019-06-19 DIAGNOSIS — F319 Bipolar disorder, unspecified: Secondary | ICD-10-CM

## 2019-06-19 DIAGNOSIS — F411 Generalized anxiety disorder: Secondary | ICD-10-CM

## 2019-06-19 MED ORDER — DIVALPROEX SODIUM ER 500 MG PO TB24: 1500.0000 mg | ORAL_TABLET | Freq: Every day | ORAL | 1 refills | Status: DC

## 2019-06-19 MED ORDER — BUSPIRONE HCL 15 MG PO TABS: 15.0000 mg | ORAL_TABLET | Freq: Two times a day (BID) | ORAL | 1 refills | Status: DC

## 2019-06-19 NOTE — Progress Notes (Signed)
Jorge Tate [id number] 22-Oct-1990 28 y.o.  Virtual Visit via Telephone Note  I connected with pt on 06/19/19 at 11:00 AM EDT by telephone and verified that I am speaking with the correct person using two identifiers.   I discussed the limitations, risks, security and privacy concerns of performing an evaluation and management service by telephone and the availability of in person appointments. I also discussed with the patient that there may be a patient responsible charge related to this service. The patient expressed understanding and agreed to proceed.   I discussed the assessment and treatment plan with the patient. The patient was provided an opportunity to ask questions and all were answered. The patient agreed with the plan and demonstrated an understanding of the instructions.   The patient was advised to call back or seek an in-person evaluation if the symptoms worsen or if the condition fails to improve as anticipated.  I provided 10 minutes of non-face-to-face time during this encounter.  The patient was located at home.  The provider was located at [place].   Thayer Headings, PMHNP   Subjective:   Patient ID:  Jorge Tate is a 28 y.o. (DOB 1991/02/08) male.  Chief Complaint: No chief complaint on file.   HPI Jorge Tate presents for follow-up of mood and anxiety.  "The medicines are working great and no Multimedia programmer." He reports that his mood has been stable. Denies irritable or depressed moods. He reports that anxiety has been manageable with the exception of a few days where he had some anxiety that was manageable. He reports that sleep has been signficantly better and averges about 7.5 hours a night. Appetite has been good. Denies low energy or low motivation. Reports that concentration has been good. Denies impulsive or risky behavior. Denies SI.   Reports that he does not have insurance and it will be several months at least before he may have  insurance. 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   Review of Systems:  Review of Systems  Musculoskeletal: Negative for gait problem.  Neurological: Negative for tremors.  Psychiatric/Behavioral:       Please refer to HPI    Medications: I have reviewed the patient's current medications.  Current Outpatient Medications  Medication Sig Dispense Refill  . busPIRone (BUSPAR) 15 MG tablet Take 1 tablet (15 mg total) by mouth 2 (two) times daily. 180 tablet 1  . divalproex (DEPAKOTE ER) 500 MG 24 hr tablet Take 3 tablets (1,500 mg total) by mouth at bedtime. 90 tablet 1   No current facility-administered medications for this visit.     Medication Side Effects: None  Allergies: No Known Allergies  Past Medical History:  Diagnosis Date  . ADHD (attention deficit hyperactivity disorder)   . Low back pain     Family History  Problem Relation Age of Onset  . Anxiety disorder Father   . Pulmonary embolism Father     Social History   Socioeconomic History  . Marital status: Married    Spouse name: Not on file  . Number of children: Not on file  . Years of education: Not on file  . Highest education level: Not on file  Occupational History  . Not on file  Social Needs  . Financial resource strain: Not on file  . Food insecurity    Worry: Not on file    Inability: Not on file  . Transportation needs    Medical: Not on file  Non-medical: Not on file  Tobacco Use  . Smoking status: Never Smoker  . Smokeless tobacco: Never Used  Substance and Sexual Activity  . Alcohol use: Yes    Comment: rare  . Drug use: Not Currently  . Sexual activity: Not on file  Lifestyle  . Physical activity    Days per week: Not on file    Minutes per session: Not on file  . Stress: Not on file  Relationships  . Social Musician on phone: Not on file    Gets together: Not on file    Attends religious service: Not on file    Active  member of club or organization: Not on file    Attends meetings of clubs or organizations: Not on file    Relationship status: Not on file  . Intimate partner violence    Fear of current or ex partner: Not on file    Emotionally abused: Not on file    Physically abused: Not on file    Forced sexual activity: Not on file  Other Topics Concern  . Not on file  Social History Narrative  . Not on file    Past Medical History, Surgical history, Social history, and Family history were reviewed and updated as appropriate.   Please see review of systems for further details on the patient's review from today.   Objective:   Physical Exam:  There were no vitals taken for this visit.  Physical Exam Neurological:     Mental Status: He is alert and oriented to person, place, and time.     Cranial Nerves: No dysarthria.  Psychiatric:        Attention and Perception: Attention normal.        Mood and Affect: Mood normal.        Speech: Speech normal.        Behavior: Behavior is cooperative.        Thought Content: Thought content normal. Thought content is not paranoid or delusional. Thought content does not include homicidal or suicidal ideation. Thought content does not include homicidal or suicidal plan.        Cognition and Memory: Cognition and memory normal.        Judgment: Judgment normal.     Lab Review:  No results found for: NA, K, CL, CO2, GLUCOSE, BUN, CREATININE, CALCIUM, PROT, ALBUMIN, AST, ALT, ALKPHOS, BILITOT, GFRNONAA, GFRAA  No results found for: WBC, RBC, HGB, HCT, PLT, MCV, MCH, MCHC, RDW, LYMPHSABS, MONOABS, EOSABS, BASOSABS  No results found for: POCLITH, LITHIUM   No results found for: PHENYTOIN, PHENOBARB, VALPROATE, CBMZ   .res Assessment: Plan:   Will continue current plan of care since pt reports that mood and anxiety s/s are well controlled without tolerability issues.  Advised pt to notify provider if he obtains health insurance before next visit and  labs could be ordered since pt has requested in the past that he would like to defer lab testing until he has health insurance.  Pt to f/u in 6 months or sooner if clinically indicated.  Patient advised to contact office with any questions, adverse effects, or acute worsening in signs and symptoms.  Diagnoses and all orders for this visit:  Generalized anxiety disorder -     busPIRone (BUSPAR) 15 MG tablet; Take 1 tablet (15 mg total) by mouth 2 (two) times daily.  PTSD (post-traumatic stress disorder) -     busPIRone (BUSPAR) 15 MG tablet; Take 1 tablet (  15 mg total) by mouth 2 (two) times daily.  Bipolar affective disorder, remission status unspecified (HCC) -     divalproex (DEPAKOTE ER) 500 MG 24 hr tablet; Take 3 tablets (1,500 mg total) by mouth at bedtime.    Please see After Visit Summary for patient specific instructions.  No future appointments.  No orders of the defined types were placed in this encounter.     -------------------------------

## 2019-09-28 ENCOUNTER — Other Ambulatory Visit: Payer: Self-pay

## 2019-09-28 ENCOUNTER — Telehealth: Payer: Self-pay | Admitting: Psychiatry

## 2019-09-28 DIAGNOSIS — F411 Generalized anxiety disorder: Secondary | ICD-10-CM

## 2019-09-28 DIAGNOSIS — F4310 Post-traumatic stress disorder, unspecified: Secondary | ICD-10-CM

## 2019-09-28 MED ORDER — BUSPIRONE HCL 15 MG PO TABS: 15.0000 mg | ORAL_TABLET | Freq: Two times a day (BID) | ORAL | 1 refills | Status: DC

## 2019-09-28 NOTE — Telephone Encounter (Signed)
[facility] updated and Buspar 15 mg Rx submitted

## 2019-09-28 NOTE — Telephone Encounter (Signed)
Pt called to request refill on Buspar 15 mg @ new pharmacy [facility] [address]. Will be Pharmacy now that he has insurance. Next appt 3/4

## 2019-12-03 ENCOUNTER — Other Ambulatory Visit: Payer: Self-pay

## 2019-12-03 ENCOUNTER — Ambulatory Visit: Payer: BC Managed Care – PPO | Admitting: Psychiatry

## 2019-12-03 ENCOUNTER — Encounter: Payer: Self-pay | Admitting: Psychiatry

## 2019-12-03 VITALS — Wt 189.0 lb

## 2019-12-03 DIAGNOSIS — F319 Bipolar disorder, unspecified: Secondary | ICD-10-CM

## 2019-12-03 DIAGNOSIS — F411 Generalized anxiety disorder: Secondary | ICD-10-CM

## 2019-12-03 DIAGNOSIS — Z79899 Other long term (current) drug therapy: Secondary | ICD-10-CM

## 2019-12-03 DIAGNOSIS — F4310 Post-traumatic stress disorder, unspecified: Secondary | ICD-10-CM | POA: Diagnosis not present

## 2019-12-03 MED ORDER — BUSPIRONE HCL 15 MG PO TABS: 15.0000 mg | ORAL_TABLET | Freq: Two times a day (BID) | ORAL | 1 refills | Status: DC

## 2019-12-03 MED ORDER — DIVALPROEX SODIUM ER 500 MG PO TB24: 1500.0000 mg | ORAL_TABLET | Freq: Every day | ORAL | 1 refills | Status: DC

## 2019-12-03 NOTE — Progress Notes (Signed)
[name] [id number] 08-Dec-1990 29 y.o.  Subjective:   Patient ID:  Jorge Tate is a 29 y.o. (DOB [birth year]) male.  Chief Complaint:  Chief Complaint  Patient presents with  . Follow-up    h/o mood disturbance    HPI Jorge Tate presents to the office today for follow-up of mood and anxiety. Mood has been stable and denies irritability. Denies depressed mood. Occ anxiety, typically in the morning and sometimes accompanied with some worry. Denies physical s/s with anxiety. Denies any episodes of severe anger. Sleeping well. Sleeping 5-7 hours a night. Appetite is stable. Energy and motivation have been stable. Denies concentration impairment. Denies SI.    Work is going well.   Review of Systems:  Review of Systems  Gastrointestinal: Negative for diarrhea.  Musculoskeletal: Negative for gait problem.  Neurological: Negative for tremors.  Psychiatric/Behavioral:       Please refer to HPI    Medications: I have reviewed the patient's current medications.  Current Outpatient Medications  Medication Sig Dispense Refill  . busPIRone (BUSPAR) 15 MG tablet Take 1 tablet (15 mg total) by mouth 2 (two) times daily. 180 tablet 1  . divalproex (DEPAKOTE ER) 500 MG 24 hr tablet Take 3 tablets (1,500 mg total) by mouth at bedtime. 90 tablet 1   No current facility-administered medications for this visit.    Medication Side Effects: None  Allergies: No Known Allergies  Past Medical History:  Diagnosis Date  . ADHD (attention deficit hyperactivity disorder)   . Low back pain     Family History  Problem Relation Age of Onset  . Anxiety disorder Father   . Pulmonary embolism Father     Social History   Socioeconomic History  . Marital status: Married    Spouse name: Not on file  . Number of children: Not on file  . Years of education: Not on file  . Highest education level: Not on file  Occupational History  . Not on file  Tobacco Use  . Smoking status: Never  Smoker  . Smokeless tobacco: Never Used  Substance and Sexual Activity  . Alcohol use: Yes    Comment: rare  . Drug use: Not Currently  . Sexual activity: Not on file  Other Topics Concern  . Not on file  Social History Narrative  . Not on file   Social Determinants of Health   Financial Resource Strain:   . Difficulty of Paying Living Expenses: Not on file  Food Insecurity:   . Worried About Charity fundraiser in the Last Year: Not on file  . Ran Out of Food in the Last Year: Not on file  Transportation Needs:   . Lack of Transportation (Medical): Not on file  . Lack of Transportation (Non-Medical): Not on file  Physical Activity:   . Days of Exercise per Week: Not on file  . Minutes of Exercise per Session: Not on file  Stress:   . Feeling of Stress : Not on file  Social Connections:   . Frequency of Communication with Friends and Family: Not on file  . Frequency of Social Gatherings with Friends and Family: Not on file  . Attends Religious Services: Not on file  . Active Member of Clubs or Organizations: Not on file  . Attends Archivist Meetings: Not on file  . Marital Status: Not on file  Intimate Partner Violence:   . Fear of Current or Ex-Partner: Not on file  . Emotionally Abused: Not on  file  . Physically Abused: Not on file  . Sexually Abused: Not on file    Past Medical History, Surgical history, Social history, and Family history were reviewed and updated as appropriate.   Please see review of systems for further details on the patient's review from today.   Objective:   Physical Exam:  Wt 189 lb (85.7 kg)   BMI 25.63 kg/m   Physical Exam Constitutional:      General: He is not in acute distress. Musculoskeletal:        General: No deformity.  Neurological:     Mental Status: He is alert and oriented to person, place, and time.     Coordination: Coordination normal.  Psychiatric:        Attention and Perception: Attention and  perception normal. He does not perceive auditory or visual hallucinations.        Mood and Affect: Mood normal. Mood is not anxious or depressed. Affect is not labile, blunt, angry or inappropriate.        Speech: Speech normal.        Behavior: Behavior normal.        Thought Content: Thought content normal. Thought content is not paranoid or delusional. Thought content does not include homicidal or suicidal ideation. Thought content does not include homicidal or suicidal plan.        Cognition and Memory: Cognition and memory normal.        Judgment: Judgment normal.     Comments: Insight intact     Lab Review:  No results found for: NA, K, CL, CO2, GLUCOSE, BUN, CREATININE, CALCIUM, PROT, ALBUMIN, AST, ALT, ALKPHOS, BILITOT, GFRNONAA, GFRAA  No results found for: WBC, RBC, HGB, HCT, PLT, MCV, MCH, MCHC, RDW, LYMPHSABS, MONOABS, EOSABS, BASOSABS  No results found for: POCLITH, LITHIUM   No results found for: PHENYTOIN, PHENOBARB, VALPROATE, CBMZ   .res Assessment: Plan:   Will continue current plan of care since target signs and symptoms are well controlled without any tolerability issues. Will order labs to monitor for adverse effects obtain Depakote level since patient now has health insurance.  Will order valproic acid level and CBC, CMP. Patient follow-up in 6 months or sooner if clinically indicated. Patient advised to contact office with any questions, adverse effects, or acute worsening in signs and symptoms.  Audric was seen today for follow-up.  Diagnoses and all orders for this visit:  Bipolar affective disorder, remission status unspecified (HCC) -     divalproex (DEPAKOTE ER) 500 MG 24 hr tablet; Take 3 tablets (1,500 mg total) by mouth at bedtime.  PTSD (post-traumatic stress disorder) -     busPIRone (BUSPAR) 15 MG tablet; Take 1 tablet (15 mg total) by mouth 2 (two) times daily.  Generalized anxiety disorder -     busPIRone (BUSPAR) 15 MG tablet; Take 1 tablet  (15 mg total) by mouth 2 (two) times daily.  High risk medication use -     Valproic acid level -     CBC -     Comprehensive metabolic panel     Please see After Visit Summary for patient specific instructions.  Future Appointments  Date Time Provider Department Center  06/07/2020 10:00 AM Corie Chiquito, PMHNP [facility] None    Orders Placed This Encounter  Procedures  . Valproic acid level  . CBC  . Comprehensive metabolic panel    -------------------------------

## 2019-12-07 ENCOUNTER — Other Ambulatory Visit: Payer: Self-pay | Admitting: Psychiatry

## 2019-12-07 DIAGNOSIS — F319 Bipolar disorder, unspecified: Secondary | ICD-10-CM

## 2020-06-07 ENCOUNTER — Ambulatory Visit: Payer: BC Managed Care – PPO | Admitting: Psychiatry

## 2020-06-22 ENCOUNTER — Other Ambulatory Visit: Payer: Self-pay

## 2020-06-22 ENCOUNTER — Encounter: Payer: Self-pay | Admitting: Psychiatry

## 2020-06-22 ENCOUNTER — Ambulatory Visit (INDEPENDENT_AMBULATORY_CARE_PROVIDER_SITE_OTHER): Payer: BC Managed Care – PPO | Admitting: Psychiatry

## 2020-06-22 DIAGNOSIS — F319 Bipolar disorder, unspecified: Secondary | ICD-10-CM | POA: Diagnosis not present

## 2020-06-22 DIAGNOSIS — F4310 Post-traumatic stress disorder, unspecified: Secondary | ICD-10-CM

## 2020-06-22 DIAGNOSIS — Z79899 Other long term (current) drug therapy: Secondary | ICD-10-CM

## 2020-06-22 DIAGNOSIS — F411 Generalized anxiety disorder: Secondary | ICD-10-CM

## 2020-06-22 MED ORDER — DIVALPROEX SODIUM ER 500 MG PO TB24
1500.0000 mg | ORAL_TABLET | Freq: Every day | ORAL | 1 refills | Status: AC
Start: 2020-06-22 — End: 2020-07-22

## 2020-06-22 MED ORDER — BUSPIRONE HCL 15 MG PO TABS: 15.0000 mg | ORAL_TABLET | Freq: Two times a day (BID) | ORAL | 1 refills | Status: AC

## 2020-06-22 NOTE — Progress Notes (Signed)
Jorge Tate [id number] Dec 16, 1990 29 y.o.  Subjective:   Patient ID:  Jorge Tate is a 29 y.o. (DOB [birth year]) male.  Chief Complaint:  Chief Complaint  Patient presents with  . Follow-up    Mood Disorder    HPI [name] presents to the office today for follow-up of mood disorder. He reports that he has been doing well. He reports that his mood has been good. He reports that he has had some slight irritability with change in work schedule from 4 am- 2:30 pm. He reports that he is still adjusting to new schedule. Denies depressed mood. Denies anxiety. Sleeping about 7 hours a night. Appetite is good. Energy and motivation have been good. Concentration is good. Denies risky or impulsive behavior. Denies SI.    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 Denies any major psychosocial changes.   Has not had labs drawn yet.  Review of Systems:  Review of Systems  Gastrointestinal: Negative.   Musculoskeletal: Negative for gait problem.  Neurological: Negative for tremors.  Psychiatric/Behavioral:       Please refer to HPI    Medications: I have reviewed the patient's current medications.  Current Outpatient Medications  Medication Sig Dispense Refill  . busPIRone (BUSPAR) 15 MG tablet Take 1 tablet (15 mg total) by mouth 2 (two) times daily. 180 tablet 1  . divalproex (DEPAKOTE ER) 500 MG 24 hr tablet Take 3 tablets (1,500 mg total) by mouth at bedtime. 270 tablet 1   No current facility-administered medications for this visit.    Medication Side Effects: None  Allergies: No Known Allergies  Past Medical History:  Diagnosis Date  . ADHD (attention deficit hyperactivity disorder)   . Low back pain     Family History  Problem Relation Age of Onset  . Anxiety disorder Father   . Pulmonary embolism Father     Social History   Socioeconomic History  . Marital status: Married    Spouse  name: Not on file  . Number of children: Not on file  . Years of education: Not on file  . Highest education level: Not on file  Occupational History  . Not on file  Tobacco Use  . Smoking status: Never Smoker  . Smokeless tobacco: Never Used  Substance and Sexual Activity  . Alcohol use: Yes    Comment: rare  . Drug use: Not Currently  . Sexual activity: Not on file  Other Topics Concern  . Not on file  Social History Narrative  . Not on file   Social Determinants of Health   Financial Resource Strain:   . Difficulty of Paying Living Expenses: Not on file  Food Insecurity:   . Worried About Programme researcher, broadcasting/film/video in the Last Year: Not on file  . Ran Out of Food in the Last Year: Not on file  Transportation Needs:   . Lack of Transportation (Medical): Not on file  . Lack of Transportation (Non-Medical): Not on file  Physical Activity:   . Days of Exercise per Week: Not on file  . Minutes of Exercise per Session: Not on file  Stress:   . Feeling of Stress : Not on file  Social Connections:   . Frequency of Communication with Friends and Family: Not on file  . Frequency of Social Gatherings with Friends and Family: Not on file  . Attends Religious Services: Not on file  .  Active Member of Clubs or Organizations: Not on file  . Attends Banker Meetings: Not on file  . Marital Status: Not on file  Intimate Partner Violence:   . Fear of Current or Ex-Partner: Not on file  . Emotionally Abused: Not on file  . Physically Abused: Not on file  . Sexually Abused: Not on file    Past Medical History, Surgical history, Social history, and Family history were reviewed and updated as appropriate.   Please see review of systems for further details on the patient's review from today.   Objective:   Physical Exam:  There were no vitals taken for this visit.  Physical Exam Constitutional:      General: He is not in acute distress. Musculoskeletal:        General:  No deformity.  Neurological:     Mental Status: He is alert and oriented to person, place, and time.     Coordination: Coordination normal.  Psychiatric:        Attention and Perception: Attention and perception normal. He does not perceive auditory or visual hallucinations.        Mood and Affect: Mood normal. Mood is not anxious or depressed. Affect is not labile, blunt, angry or inappropriate.        Speech: Speech normal.        Behavior: Behavior normal.        Thought Content: Thought content normal. Thought content is not paranoid or delusional. Thought content does not include homicidal or suicidal ideation. Thought content does not include homicidal or suicidal plan.        Cognition and Memory: Cognition and memory normal.        Judgment: Judgment normal.     Comments: Insight intact     Lab Review:  No results found for: NA, K, CL, CO2, GLUCOSE, BUN, CREATININE, CALCIUM, PROT, ALBUMIN, AST, ALT, ALKPHOS, BILITOT, GFRNONAA, GFRAA  No results found for: WBC, RBC, HGB, HCT, PLT, MCV, MCH, MCHC, RDW, LYMPHSABS, MONOABS, EOSABS, BASOSABS  No results found for: POCLITH, LITHIUM   No results found for: PHENYTOIN, PHENOBARB, VALPROATE, CBMZ   .res Assessment: Plan:   Continue Depakote ER 1500 mg po QHS for mood stabilization.  Will order labs to monitor for potential adverse effects to include CMP and CBC.  We will also order valproic acid level to determine drug level. Patient to follow-up in 6 months or sooner if clinically indicated. Patient advised to contact office with any questions, adverse effects, or acute worsening in signs and symptoms.   Tallon was seen today for follow-up.  Diagnoses and all orders for this visit:  High risk medication use -     Comprehensive metabolic panel -     CBC -     Valproic acid level  PTSD (post-traumatic stress disorder) -     busPIRone (BUSPAR) 15 MG tablet; Take 1 tablet (15 mg total) by mouth 2 (two) times  daily.  Generalized anxiety disorder -     busPIRone (BUSPAR) 15 MG tablet; Take 1 tablet (15 mg total) by mouth 2 (two) times daily.  Bipolar affective disorder, remission status unspecified (HCC) -     divalproex (DEPAKOTE ER) 500 MG 24 hr tablet; Take 3 tablets (1,500 mg total) by mouth at bedtime.     Please see After Visit Summary for patient specific instructions.  Future Appointments  Date Time Provider Department Center  12/20/2020 10:00 AM Corie Chiquito, PMHNP [facility] None  Orders Placed This Encounter  Procedures  . Comprehensive metabolic panel  . CBC  . Valproic acid level    -------------------------------

## 2020-12-20 ENCOUNTER — Ambulatory Visit: Payer: BC Managed Care – PPO | Admitting: Psychiatry

## 2023-08-14 ENCOUNTER — Encounter: Payer: Self-pay | Admitting: Psychiatry
